# Patient Record
Sex: Male | Born: 1990 | Race: Black or African American | Hispanic: No | Marital: Single | Smoking: Current every day smoker
Health system: Southern US, Community
[De-identification: ages and names within clinical notes are randomized; demographics above are authoritative.]

---

## 2014-01-22 ENCOUNTER — Encounter (HOSPITAL_COMMUNITY): Payer: Self-pay | Admitting: Emergency Medicine

## 2014-01-22 ENCOUNTER — Emergency Department (HOSPITAL_COMMUNITY)
Admission: EM | Admit: 2014-01-22 | Discharge: 2014-01-23 | Discharge status: Against Medical Advice | Payer: Medicaid Other | Attending: Emergency Medicine | Admitting: Emergency Medicine

## 2014-01-22 DIAGNOSIS — N442 Benign cyst of testis: Secondary | ICD-10-CM | POA: Insufficient documentation

## 2014-01-22 DIAGNOSIS — Z720 Tobacco use: Secondary | ICD-10-CM | POA: Insufficient documentation

## 2014-01-22 NOTE — ED Notes (Signed)
Pt. reports small cyst at testicles onset 2 days ago denies injury/ no drainage , no dysuria or fever .

## 2014-01-31 ENCOUNTER — Encounter (HOSPITAL_COMMUNITY): Payer: Self-pay | Admitting: Emergency Medicine

## 2014-01-31 ENCOUNTER — Emergency Department (HOSPITAL_COMMUNITY)
Admission: EM | Admit: 2014-01-31 | Discharge: 2014-01-31 | Disposition: A | Discharge status: Home / Self Care | Payer: Medicaid Other | Attending: Emergency Medicine | Admitting: Emergency Medicine

## 2014-01-31 ENCOUNTER — Emergency Department (HOSPITAL_COMMUNITY): Payer: Medicaid Other

## 2014-01-31 DIAGNOSIS — M791 Myalgia: Secondary | ICD-10-CM | POA: Diagnosis not present

## 2014-01-31 DIAGNOSIS — R110 Nausea: Secondary | ICD-10-CM | POA: Diagnosis not present

## 2014-01-31 DIAGNOSIS — Z720 Tobacco use: Secondary | ICD-10-CM | POA: Insufficient documentation

## 2014-01-31 DIAGNOSIS — R52 Pain, unspecified: Secondary | ICD-10-CM | POA: Diagnosis present

## 2014-01-31 DIAGNOSIS — J069 Acute upper respiratory infection, unspecified: Secondary | ICD-10-CM | POA: Diagnosis not present

## 2014-01-31 DIAGNOSIS — R079 Chest pain, unspecified: Secondary | ICD-10-CM | POA: Diagnosis not present

## 2014-01-31 DIAGNOSIS — R072 Precordial pain: Secondary | ICD-10-CM

## 2014-01-31 DIAGNOSIS — R059 Cough, unspecified: Secondary | ICD-10-CM

## 2014-01-31 DIAGNOSIS — R05 Cough: Secondary | ICD-10-CM

## 2014-01-31 LAB — I-STAT CHEM 8, ED
BUN: 12 mg/dL (ref 6–23)
CALCIUM ION: 1.21 mmol/L (ref 1.12–1.23)
CHLORIDE: 102 meq/L (ref 96–112)
Creatinine, Ser: 1.2 mg/dL (ref 0.50–1.35)
Glucose, Bld: 90 mg/dL (ref 70–99)
HCT: 44 % (ref 39.0–52.0)
HEMOGLOBIN: 15 g/dL (ref 13.0–17.0)
Potassium: 3.6 mEq/L — ABNORMAL LOW (ref 3.7–5.3)
Sodium: 139 mEq/L (ref 137–147)
TCO2: 26 mmol/L (ref 0–100)

## 2014-01-31 LAB — I-STAT TROPONIN, ED: Troponin i, poc: 0.01 ng/mL (ref 0.00–0.08)

## 2014-01-31 MED ORDER — KETOROLAC TROMETHAMINE 60 MG/2ML IM SOLN
60.0000 mg | Freq: Once | INTRAMUSCULAR | Status: AC
  Administered 2014-01-31: 60 mg via INTRAMUSCULAR
  Filled 2014-01-31: qty 2

## 2014-01-31 MED ORDER — PROMETHAZINE HCL 25 MG PO TABS: 25.0000 mg | ORAL_TABLET | Freq: Four times a day (QID) | ORAL | Status: AC | PRN

## 2014-01-31 MED ORDER — IBUPROFEN 800 MG PO TABS: 800.0000 mg | ORAL_TABLET | Freq: Three times a day (TID) | ORAL | Status: AC

## 2014-01-31 MED ORDER — DM-GUAIFENESIN ER 30-600 MG PO TB12: 1.0000 | ORAL_TABLET | Freq: Two times a day (BID) | ORAL | Status: AC

## 2014-01-31 NOTE — ED Notes (Signed)
Pt sleeping at this time.

## 2014-01-31 NOTE — ED Notes (Signed)
Phlebotomy at bedside.

## 2014-01-31 NOTE — ED Notes (Signed)
Echo to the bedside.

## 2014-01-31 NOTE — ED Provider Notes (Signed)
Pt still c/o CP - he had ECG done early showing ST elevations diffusely - he has repeat ECG showing same - appears c/w a pericarditis picture, there are no reciprocal changes and the troponin is normal - he states that the pain is positional getting worse with leaning forward, less with leaning back.  He has no murmurs rubs or gallops and appears comfortable.  There is no change from ECG performed 30 minutes ago by Dr. Deretha EmoryZackowski.   EKG Interpretation  Date/Time:  Thursday January 31 2014 07:46:43 EDT Ventricular Rate:  53 PR Interval:  175 QRS Duration: 78 QT Interval:  434 QTC Calculation: 407 R Axis:   85 Text Interpretation:  Sinus arrhythmia Inferior infarct, acute (LCx) ST elevation, consider anterolateral injury vs pericarditis Abnormal ekg since last tracing no significant change Confirmed by Hyacinth MeekerMILLER  MD, Jonavon Trieu ([id number]) on 01/31/2014 8:11:27 AM      Results for orders placed during the hospital encounter of 01/31/14  I-STAT TROPOININ, ED      Result Value Ref Range   Troponin i, poc 0.01  0.00 - 0.08 ng/mL   Comment 3           I-STAT CHEM 8, ED      Result Value Ref Range   Sodium 139  137 - 147 mEq/L   Potassium 3.6 (*) 3.7 - 5.3 mEq/L   Chloride 102  96 - 112 mEq/L   BUN 12  6 - 23 mg/dL   Creatinine, Ser 6.061.20  0.50 - 1.35 mg/dL   Glucose, Bld 90  70 - 99 mg/dL   Calcium, Ion 3.011.21  6.011.12 - 1.23 mmol/L   TCO2 26  0 - 100 mmol/L   Hemoglobin 15.0  13.0 - 17.0 g/dL   HCT 09.344.0  23.539.0 - 57.352.0 %   Dg Chest 2 View  01/31/2014   CLINICAL DATA:  Initial evaluation for generalized body aches, numbness.  EXAM: CHEST  2 VIEW  COMPARISON:  None.  FINDINGS: The cardiac and mediastinal silhouettes are within normal limits.  The lungs are normally inflated. No airspace consolidation, pleural effusion, or pulmonary edema is identified. There is no pneumothorax.  No acute osseous abnormality identified.  IMPRESSION: No active cardiopulmonary disease.   Electronically Signed   By: [name]   [name] M.D.   On: 01/31/2014 06:06   D/w Dr. Mayford Knifeurner - she will obtain echo on pt - agrees likely early repol, less likely pericarditis or ischemia. 8:20 AM  Echo normal, pt to be d;c   Vida RollerBrian D Veer Elamin, MD 01/31/14 2045

## 2014-01-31 NOTE — ED Notes (Signed)
ED EKG completed and given to Dr. Deretha EmoryZackowski.

## 2014-01-31 NOTE — ED Provider Notes (Addendum)
CSN: [id number]     Arrival date & time 01/31/14  0245 History   First MD Initiated Contact with Patient 01/31/14 0422     Chief Complaint  Patient presents with  . Generalized Body Aches  . Numbness     (Consider location/radiation/quality/duration/timing/severity/associated sxs/prior Treatment) The history is provided by the patient.   [age] year old male presents with complaint of bodyaches with nausea dry cough but not persistence. Also did mention some left hand numbness but this resolved. Chest congestion. A nonproductive cough. Patient with bilateral chest pain states does not seem to be worse with the cough. No radiation to the back. The chest discomfort is about 4/10.  History reviewed. No pertinent past medical history. History reviewed. No pertinent past surgical history. No family history on file. History  Substance Use Topics  . Smoking status: Current Every Day Smoker  . Smokeless tobacco: Not on file  . Alcohol Use: Yes    Review of Systems  Constitutional: Negative for fever.  HENT: Positive for congestion.   Eyes: Negative for visual disturbance.  Respiratory: Negative for shortness of breath.   Cardiovascular: Positive for chest pain. Negative for palpitations and leg swelling.  Gastrointestinal: Positive for nausea. Negative for vomiting and abdominal pain.  Genitourinary: Negative for hematuria.  Musculoskeletal: Positive for myalgias. Negative for back pain.  Skin: Negative for rash.  Neurological: Negative for headaches.  Hematological: Does not bruise/bleed easily.  Psychiatric/Behavioral: Negative for confusion.      Allergies  Review of patient's allergies indicates no known allergies.  Home Medications   Prior to Admission medications   Medication Sig Start Date End Date Taking? Authorizing Provider  aspirin 325 MG tablet Take 650 mg by mouth every 6 (six) hours as needed for mild pain.   Yes Historical Provider, MD   dextromethorphan-guaiFENesin (MUCINEX DM) 30-600 MG per 12 hr tablet Take 1 tablet by mouth 2 (two) times daily. 01/31/14   Jeremy MuldersScott Nealie Mchatton, MD  ibuprofen (ADVIL,MOTRIN) 800 MG tablet Take 1 tablet (800 mg total) by mouth 3 (three) times daily. 01/31/14   Jeremy MuldersScott Jeremy Swart, MD  promethazine (PHENERGAN) 25 MG tablet Take 1 tablet (25 mg total) by mouth every 6 (six) hours as needed for nausea or vomiting. 01/31/14   Jeremy MuldersScott Jeremy Gerst, MD   BP 109/69  Pulse 52  Temp(Src) 98.5 F (36.9 C) (Oral)  Resp 13  SpO2 100% Physical Exam  Nursing note and vitals reviewed. Constitutional: He is oriented to person, place, and time. He appears well-developed and well-nourished. No distress.  HENT:  Head: Normocephalic and atraumatic.  Mouth/Throat: Oropharynx is clear and moist.  Eyes: Conjunctivae and EOM are normal. Pupils are equal, round, and reactive to light.  Neck: Normal range of motion.  Cardiovascular: Normal rate, regular rhythm and normal heart sounds.   Pulmonary/Chest: Effort normal and breath sounds normal. No respiratory distress.  Abdominal: Soft. Bowel sounds are normal. There is no tenderness.  Musculoskeletal: Normal range of motion. He exhibits no edema.  Neurological: He is alert and oriented to person, place, and time. No cranial nerve deficit. He exhibits normal muscle tone. Coordination normal.  Skin: Skin is warm. No rash noted.    ED Course  Procedures (including critical care time) Labs Review Labs Reviewed  I-STAT TROPOININ, ED  I-STAT CHEM 8, ED  Rosezena SensorI-STAT TROPOININ, ED    Imaging Review Dg Chest 2 View  01/31/2014   CLINICAL DATA:  Initial evaluation for generalized body aches, numbness.  EXAM: CHEST  2 VIEW  COMPARISON:  None.  FINDINGS: The cardiac and mediastinal silhouettes are within normal limits.  The lungs are normally inflated. No airspace consolidation, pleural effusion, or pulmonary edema is identified. There is no pneumothorax.  No acute osseous  abnormality identified.  IMPRESSION: No active cardiopulmonary disease.   Electronically Signed   By: Jeremy MuBenjamin  Oneal M.D.   On: 01/31/2014 06:06     EKG Interpretation None      Date: 01/31/2014  Rate: 46  Rhythm: sinus bradycardia  QRS Axis: normal  Intervals: normal  ST/T Wave abnormalities: ST elevations diffusely and early repolarization  Conduction Disutrbances:none  Narrative Interpretation:   Old EKG Reviewed: none available      MDM   Final diagnoses:  Chest pain, unspecified chest pain type  Cough  URI (upper respiratory infection)    The patient most likely with upper respiratory type infection. Dry cough nausea chest congestion. Chest x-ray negative for pneumonia. The patient was also complaining of the bilateral chest pain down low and substernal. EKG ordered for this reason. Also pointed care troponin ordered. Patient little concerning perhaps for cocaine use.. Tired. Certainly not tachycardic not hypoxic. Not concerned about pulmonary embolus. If the clinic here labs are negative patient can be discharged. Patient discharged home with Phenergan Motrin and Mucinex DM. Patient on monitor with bradycardia. Sinus bradycardia. No significant arrhythmias.  EKG with diffuse ST segment elevation. Computer read as inferior infarct. Also has elevation anterior laterally. Patient with no significant chest pain currently. Suspect this is an early repolarization type pattern old EKG for comparison. EKG did not crossover in use.  Jeremy MuldersScott [name], MD 01/31/14 [id number]  Jeremy MuldersScott Jeremy Ureste, MD 01/31/14 [id number]  Results for orders placed during the hospital encounter of 01/31/14  I-STAT TROPOININ, ED      Result Value Ref Range   Troponin i, poc 0.01  0.00 - 0.08 ng/mL   Comment 3           I-STAT CHEM 8, ED      Result Value Ref Range   Sodium 139  137 - 147 mEq/L   Potassium 3.6 (*) 3.7 - 5.3 mEq/L   Chloride 102  96 - 112 mEq/L   BUN 12  6 - 23 mg/dL   Creatinine, Ser  4.691.20  0.50 - 1.35 mg/dL   Glucose, Bld 90  70 - 99 mg/dL   Calcium, Ion 6.291.21  5.281.12 - 1.23 mmol/L   TCO2 26  0 - 100 mmol/L   Hemoglobin 15.0  13.0 - 17.0 g/dL   HCT 41.344.0  24.439.0 - 01.052.0 %   Dg Chest 2 View  01/31/2014   CLINICAL DATA:  Initial evaluation for generalized body aches, numbness.  EXAM: CHEST  2 VIEW  COMPARISON:  None.  FINDINGS: The cardiac and mediastinal silhouettes are within normal limits.  The lungs are normally inflated. No airspace consolidation, pleural effusion, or pulmonary edema is identified. There is no pneumothorax.  No acute osseous abnormality identified.  IMPRESSION: No active cardiopulmonary disease.   Electronically Signed   By: Jeremy MuBenjamin  Oneal M.D.   On: 01/31/2014 06:06    Patient's troponin negative. Patient i-STAT chem 8 also negative for any significant abnormalities. Discharged with medications as stated above.  Jeremy MuldersScott Keatin Benham, MD 01/31/14 [phone]

## 2014-01-31 NOTE — Progress Notes (Signed)
Echocardiogram 2D Echocardiogram has been performed.  Lodie Waheed 01/31/2014, 9:12 AM

## 2014-01-31 NOTE — ED Notes (Addendum)
Pt reporting chest pain 8/10. MD notified. EKG done. Pt reports cocaine use one month ago.

## 2014-01-31 NOTE — ED Notes (Signed)
Pt. reports generalized body aches with nausea , left hand numbness , dry cough , nausea and chest congestion .

## 2014-01-31 NOTE — Discharge Instructions (Signed)
Chest x-ray negative. Take the medicines as directed. Take Phenergan for nausea. Take the Motrin for the bodyaches. Take Mucinex DM for the cough. Return for any new or worse symptoms. No evidence of pneumonia on chest x-ray.

## 2014-02-01 ENCOUNTER — Encounter (HOSPITAL_COMMUNITY): Payer: Self-pay | Admitting: Emergency Medicine

## 2014-02-01 ENCOUNTER — Emergency Department (HOSPITAL_COMMUNITY)
Admission: EM | Admit: 2014-02-01 | Discharge: 2014-02-01 | Disposition: A | Discharge status: Home / Self Care | Payer: Medicaid Other | Attending: Emergency Medicine | Admitting: Emergency Medicine

## 2014-02-01 DIAGNOSIS — Z09 Encounter for follow-up examination after completed treatment for conditions other than malignant neoplasm: Secondary | ICD-10-CM | POA: Diagnosis present

## 2014-02-01 DIAGNOSIS — Z720 Tobacco use: Secondary | ICD-10-CM | POA: Insufficient documentation

## 2014-02-01 DIAGNOSIS — Z79899 Other long term (current) drug therapy: Secondary | ICD-10-CM

## 2014-02-01 NOTE — ED Provider Notes (Signed)
CSN: [id number]     Arrival date & time 02/01/14  1356 History   First MD Initiated Contact with Patient 02/01/14 1454     Chief Complaint  Patient presents with  . URI    The history is provided by the patient. No language interpreter was used.   This chart was scribed for non-physician practitioner, Mayme GentaBen Selso Mannor PA-C working with No att. providers found, by Andrew Auaven Small, ED Scribe. This patient was seen in room WTR7/WTR7 and the patient's care was started at 8:36 AM.  Jeremy Oneal is a 23 y.o. male who presents to the Emergency Department for medication request. Pt was seen here 1 day ago for the same complaint and was prescribed medication. Pt denies changes in symptoms. Pt has not picked up prescription due to not have insurance card. Pt reports returning only to receive more medication that he does not have to pay for.  History reviewed. No pertinent past medical history. History reviewed. No pertinent past surgical history. History reviewed. No pertinent family history. History  Substance Use Topics  . Smoking status: Current Every Day Smoker    Types: Cigarettes  . Smokeless tobacco: Not on file  . Alcohol Use: Yes    Review of Systems  Constitutional: Negative for fever.  Respiratory: Negative for shortness of breath.   Cardiovascular: Negative for chest pain.  Skin: Negative for rash.  All other systems reviewed and are negative.   Allergies  Review of patient's allergies indicates no known allergies.  Home Medications   Prior to Admission medications   Medication Sig Start Date End Date Taking? Authorizing Provider  aspirin 325 MG tablet Take 650 mg by mouth every 6 (six) hours as needed for mild pain.   Yes Historical Provider, MD  dextromethorphan-guaiFENesin (MUCINEX DM) 30-600 MG per 12 hr tablet Take 1 tablet by mouth 2 (two) times daily. 01/31/14   [name] Zackowski, MD  ibuprofen (ADVIL,MOTRIN) 800 MG tablet Take 1 tablet (800 mg total) by mouth 3 (three)  times daily. 01/31/14   [name] Zackowski, MD  promethazine (PHENERGAN) 25 MG tablet Take 1 tablet (25 mg total) by mouth every 6 (six) hours as needed for nausea or vomiting. 01/31/14   [name] Zackowski, MD   BP 124/88  Pulse 55  Temp(Src) 98.2 F (36.8 C) (Oral)  Resp 16  SpO2 100% Physical Exam  Nursing note and vitals reviewed. Constitutional: He is oriented to person, place, and time. He appears well-developed and well-nourished. No distress.  Awake, alert, nontoxic appearance.  HENT:  Head: Normocephalic and atraumatic.  Eyes: Conjunctivae and EOM are normal. Right eye exhibits no discharge. Left eye exhibits no discharge.  Neck: Neck supple.  Cardiovascular: Normal rate.   Pulmonary/Chest: Effort normal. He exhibits no tenderness.  Abdominal: Soft. There is no tenderness. There is no rebound.  Musculoskeletal: Normal range of motion. He exhibits no tenderness.  Baseline ROM, no obvious new focal weakness.  Neurological: He is alert and oriented to person, place, and time.  Mental status and motor strength appears baseline for patient and situation.  Skin: Skin is warm and dry. No rash noted.  Psychiatric: He has a normal mood and affect. His behavior is normal.    ED Course  Procedures (including critical care time) DIAGNOSTIC STUDIES: Oxygen Saturation is 100% on RA, normal by my interpretation.    COORDINATION OF CARE: 8:36 AM- Pt advised of plan for treatment and pt agrees.  Labs Review Labs Reviewed - No data to display  Imaging  Review No results found.   EKG Interpretation None      MDM  Vitals stable  -afebrile. Pulse baseline for pt. No new symptoms from yesterday. Pt resting comfortably in ED. Pt given materials and resources for financial assistance. Also encouraged that he should prioritize his medications over cigarettes if he is concerned about financial difficulty.   Discussed f/u with PCP and return precautions, pt very amenable to plan. Pt  stable, in good condition and is appropriate for discharge.  Final diagnoses:  Medication management    I personally performed the services described in this documentation, which was scribed in my presence. The recorded information has been reviewed and is accurate.      Sharlene MottsBenjamin W Cyenna Rebello, PA-C 02/02/14 [phone]

## 2014-02-01 NOTE — ED Notes (Signed)
Per EMs. Pt here yesterday for respiratory infection has returned here for same. Was discharged with prescriptions to help with symptoms, has not filled prescriptions or stopped smoking. Vitals WNL with EMS.

## 2014-02-01 NOTE — Progress Notes (Signed)
[facility] ED CM consulted by ED RN alaina, about pt voiced concerns with obtaining medications. [facility] and diagnosed w/ an [facility].  Pt sts he is unable to get prescriptions filled due to financial hardship.       This pt is not a candidate for [facility]  This pt has been verified by Registration (EP[facility] e medicaid response hx in chart review) [place] medicaid [place] access covered pt who receives discounted medication assistance ($0-$3 co pays) Cm discussed this with ED RN and provided pt with a list of [place] financial assistance resources to help him find funds for co pays

## 2014-02-01 NOTE — ED Notes (Signed)
[facility] and diagnosed w/ [facility].  Pt sts he is unable to get prescriptions filled due to financial hardship.

## 2014-02-05 NOTE — ED Provider Notes (Signed)
Medical screening examination/treatment/procedure(s) were performed by non-physician practitioner and as supervising physician I was immediately available for consultation/collaboration.   EKG Interpretation None        Catalaya Garr, MD 02/05/14 0101 

## 2016-05-22 IMAGING — CR DG CHEST 2V
2 series · 2 of 2 positions shown · non-contrast
Comparison: None.

CLINICAL DATA: Initial evaluation for generalized body aches,
numbness.

EXAM:
CHEST  2 VIEW

[w chest pa]
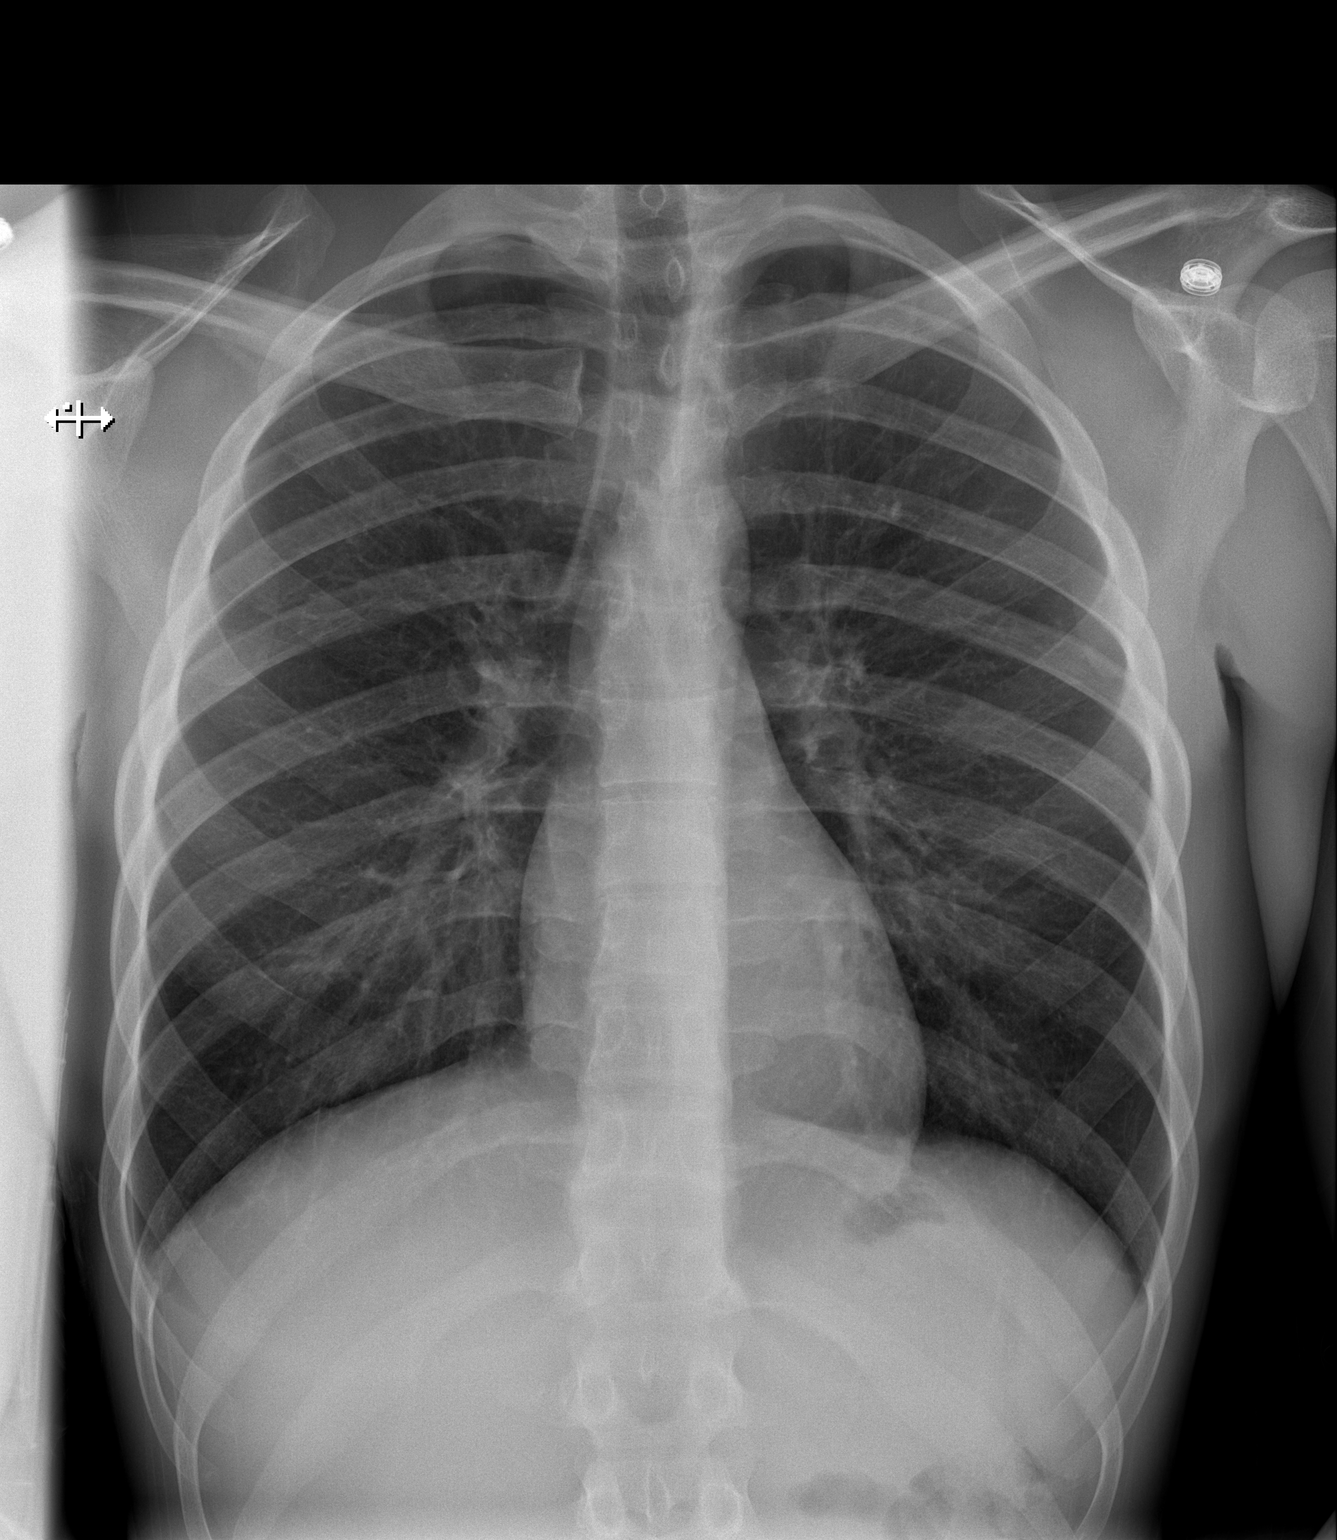

[w chest lat]
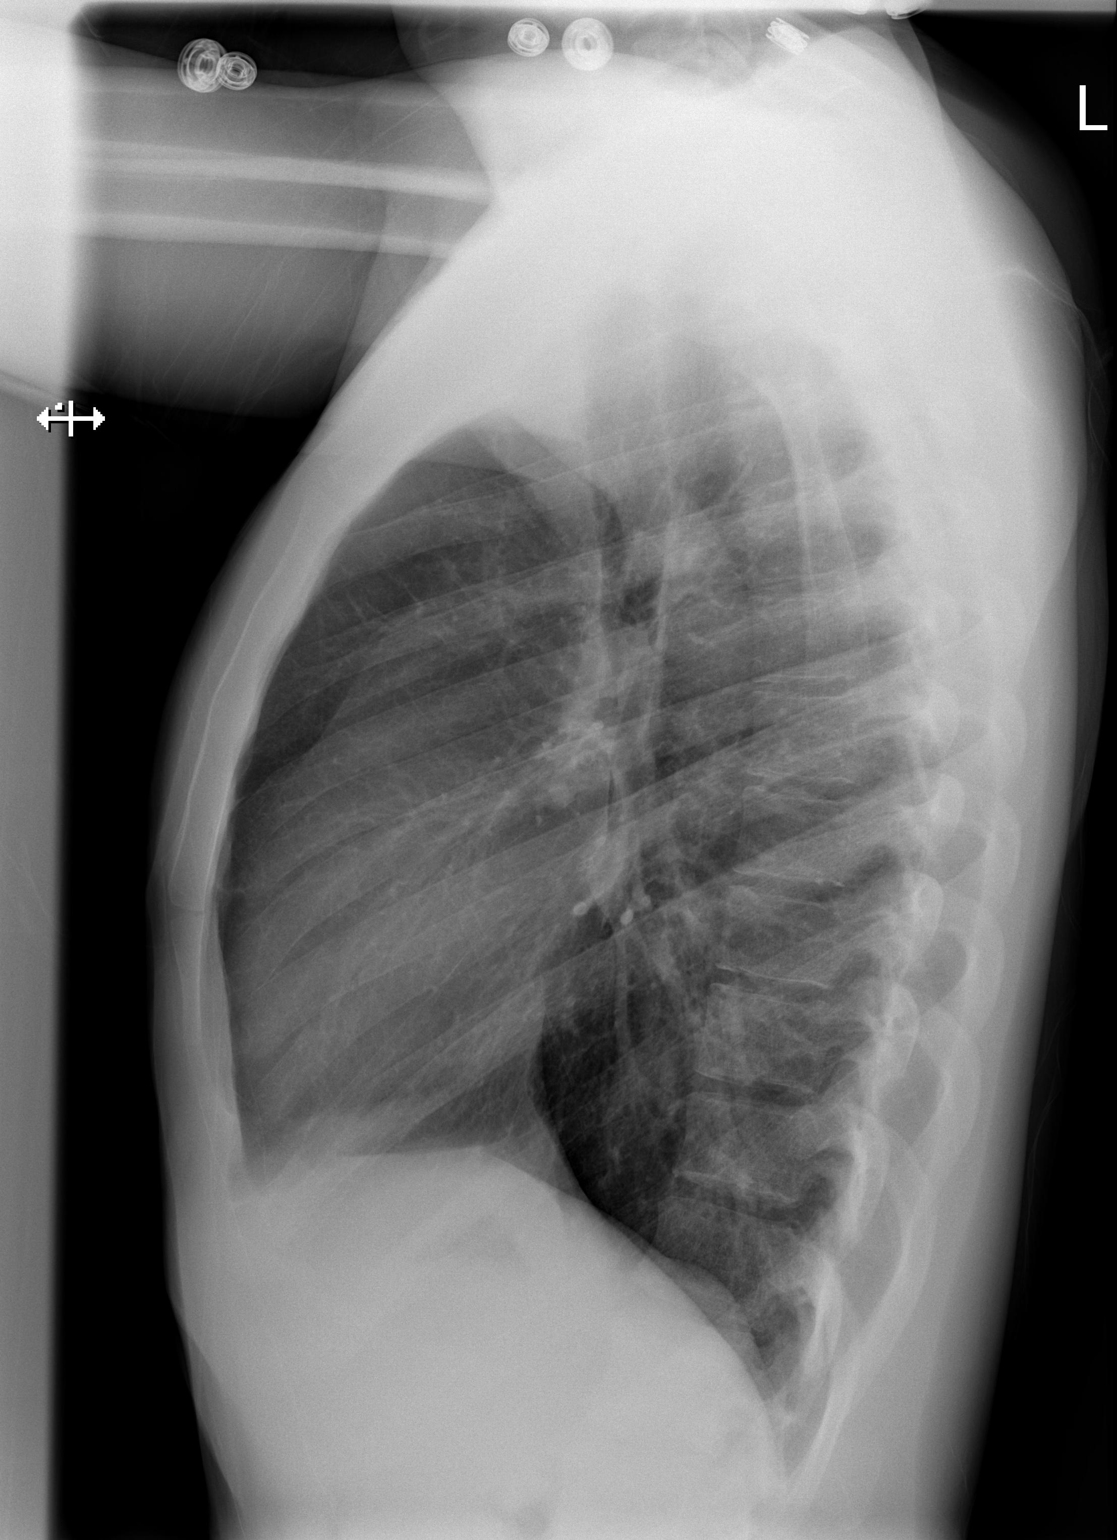

[2 of 2 positions shown; findings below may reference images not displayed]

FINDINGS: The cardiac and mediastinal silhouettes are within normal limits.

The lungs are normally inflated. No airspace consolidation, pleural
effusion, or pulmonary edema is identified. There is no
pneumothorax.

No acute osseous abnormality identified.
IMPRESSION: No active cardiopulmonary disease.
